# Patient Record
Sex: Male | Born: 2000 | Race: White | Hispanic: No | Marital: Single | Smoking: Never smoker
Health system: Southern US, Community
[De-identification: ages and names within clinical notes are randomized; demographics above are authoritative.]

## PROBLEM LIST (undated history)

## (undated) DIAGNOSIS — J45909 Unspecified asthma, uncomplicated: Secondary | ICD-10-CM

---
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
## 2020-04-22 ENCOUNTER — Ambulatory Visit: Payer: 59

## 2022-11-15 ENCOUNTER — Emergency Department (HOSPITAL_COMMUNITY): Payer: No Typology Code available for payment source

## 2022-11-15 ENCOUNTER — Emergency Department (HOSPITAL_COMMUNITY)
Admission: EM | Admit: 2022-11-15 | Discharge: 2022-11-15 | Disposition: A | Discharge status: Home / Self Care | Payer: No Typology Code available for payment source | Attending: Emergency Medicine | Admitting: Emergency Medicine

## 2022-11-15 ENCOUNTER — Other Ambulatory Visit: Payer: Self-pay

## 2022-11-15 ENCOUNTER — Encounter (HOSPITAL_COMMUNITY): Payer: Self-pay | Admitting: Emergency Medicine

## 2022-11-15 DIAGNOSIS — W228XXA Striking against or struck by other objects, initial encounter: Secondary | ICD-10-CM | POA: Insufficient documentation

## 2022-11-15 DIAGNOSIS — S90811A Abrasion, right foot, initial encounter: Secondary | ICD-10-CM | POA: Diagnosis not present

## 2022-11-15 DIAGNOSIS — S99921A Unspecified injury of right foot, initial encounter: Secondary | ICD-10-CM | POA: Diagnosis present

## 2022-11-15 DIAGNOSIS — S9031XA Contusion of right foot, initial encounter: Secondary | ICD-10-CM

## 2022-11-15 NOTE — ED Provider Notes (Signed)
[facility] EMERGENCY DEPARTMENT Provider Note   CSN: [id number] Arrival date & time: 11/15/22  2208     History  No chief complaint on file.   Jonathon Bradford is a 21 y.o. male.  Patient is a 21 year old male presenting with complaints of a right foot injury.  Patient tells me that his foot was rolled over by a pallet jack.  He describes swelling and pain to the dorsum of the right foot.  He is able to bear weight, however with a slight limp.  The history is provided by the patient.       Home Medications Prior to Admission medications   Medication Sig Start Date End Date Taking? Authorizing Provider  acetaminophen (TYLENOL) 500 MG tablet Take 1,500 mg by mouth daily as needed for moderate pain.     [provider]  HYDROcodone-acetaminophen (NORCO/VICODIN) 5-325 MG per tablet 1 at at bedtime, or every 4 hours if needed for severe pain. 11/11/14   Ivery Quale, PA-C  ibuprofen (ADVIL,MOTRIN) 800 MG tablet Take 1 tablet (800 mg total) by mouth 3 (three) times daily. 11/24/14   Vickki Hearing, MD  LORazepam (ATIVAN) 0.5 MG tablet Take 1 tablet (0.5 mg total) by mouth every 8 (eight) hours. Patient not taking: Reported on 11/11/2014 11/13/13   Donnetta Hutching, MD  naproxen sodium (ALEVE) 220 MG tablet Take 440 mg by mouth daily as needed (pain).     [provider]      Allergies    Patient has no known allergies.    Review of Systems   Review of Systems  All other systems reviewed and are negative.   Physical Exam Updated Vital Signs BP (!) 140/80 (BP Location: Right Arm)   Pulse 97   Temp 98.2 F (36.8 C) (Oral)   Resp 18   Ht 5\' 11"  (1.803 m)   Wt 59 kg   SpO2 100%   BMI 18.14 kg/m  Physical Exam Vitals and nursing note reviewed.  Constitutional:      General: He is not in acute distress.    Appearance: Normal appearance. He is not ill-appearing.  HENT:     Head: Normocephalic and atraumatic.  Pulmonary:     Effort: Pulmonary effort  is normal.  Musculoskeletal:     Comments: There are abrasions and swelling to the dorsum of the right foot.  There is no gross deformity.  Capillary refill is brisk to all toes and motor and sensation are intact throughout the entire foot.  Skin:    General: Skin is warm and dry.  Neurological:     Mental Status: He is alert.     ED Results / Procedures / Treatments   Labs (all labs ordered are listed, but only abnormal results are displayed) Labs Reviewed - No data to display  EKG None  Radiology DG Foot Complete Right  Result Date: 11/15/2022 CLINICAL DATA:  Pain and swelling. It was ran over by a 11/17/2022. EXAM: RIGHT FOOT COMPLETE - 3+ VIEW; RIGHT ANKLE - COMPLETE 3+ VIEW COMPARISON:  None Available. FINDINGS: Right ankle/foot: There is no evidence of fracture or dislocation. There is no evidence of arthropathy or other focal bone abnormality. Soft tissues swelling about anterior aspect of the ankle and dorsum of the foot. IMPRESSION: Soft tissue swelling about the anterior aspect of the ankle and dorsum of the foot. No fracture or dislocation. Electronically Signed   By: [employer] D.O.   On: 11/15/2022 22:47  DG Ankle Complete Right  Result Date: 11/15/2022 CLINICAL DATA:  Pain and swelling. It was ran over by a [employer]. EXAM: RIGHT FOOT COMPLETE - 3+ VIEW; RIGHT ANKLE - COMPLETE 3+ VIEW COMPARISON:  None Available. FINDINGS: Right ankle/foot: There is no evidence of fracture or dislocation. There is no evidence of arthropathy or other focal bone abnormality. Soft tissues swelling about anterior aspect of the ankle and dorsum of the foot. IMPRESSION: Soft tissue swelling about the anterior aspect of the ankle and dorsum of the foot. No fracture or dislocation. Electronically Signed   By: [employer] D.O.   On: 11/15/2022 22:47    Procedures Procedures    Medications Ordered in ED Medications - No data to display  ED Course/ Medical Decision Making/ A&P  X-rays  are negative for fracture.  Patient to be treated as a contusion with rest, ice, ibuprofen, and follow-up as needed.  Final Clinical Impression(s) / ED Diagnoses Final diagnoses:  None    Rx / DC Orders ED Discharge Orders     None         Veryl Speak, MD 11/15/22 2328

## 2022-11-15 NOTE — ED Triage Notes (Signed)
Pt states his right foot was hit with pallet jack. Pt is ambulatory.

## 2022-11-15 NOTE — Discharge Instructions (Addendum)
Rest.  Ice for 20 minutes every 2 hours while awake for the next 2 days.  Elevate your foot is much as possible.  Wear Ace bandage for comfort and support.  Take ibuprofen 600 mg every 6 hours as needed for pain.  Follow-up with primary doctor if symptoms or not improving in the next week.

## 2024-02-21 ENCOUNTER — Other Ambulatory Visit: Payer: Self-pay

## 2024-02-21 ENCOUNTER — Ambulatory Visit
Admission: RE | Admit: 2024-02-21 | Discharge: 2024-02-21 | Disposition: A | Discharge status: Home / Self Care | Payer: Self-pay | Source: Ambulatory Visit | Attending: Family Medicine | Admitting: Family Medicine

## 2024-02-21 VITALS — BP 140/89 | HR 100 | Temp 98.2°F | Resp 20

## 2024-02-21 DIAGNOSIS — J101 Influenza due to other identified influenza virus with other respiratory manifestations: Secondary | ICD-10-CM | POA: Diagnosis not present

## 2024-02-21 MED ORDER — PSEUDOEPH-BROMPHEN-DM 30-2-10 MG/5ML PO SYRP: 5.0000 mL | ORAL_SOLUTION | Freq: Four times a day (QID) | ORAL | 0 refills | Status: AC | PRN

## 2024-02-21 MED ORDER — OSELTAMIVIR PHOSPHATE 75 MG PO CAPS: 75.0000 mg | ORAL_CAPSULE | Freq: Two times a day (BID) | ORAL | 0 refills | Status: AC

## 2024-02-21 NOTE — ED Provider Notes (Signed)
 [facility]    CSN: [id number] Arrival date & time: 02/21/24  5784      History   Chief Complaint Chief Complaint  Patient presents with   Influenza    Took an at home Covid, Flu A & B test and Flu A tested positive - Entered by patient    HPI Jonathon Bradford is a 23 y.o. male.   Patient presenting today with 3-day history of bodyaches, cough, fever, headache, runny nose, fatigue.  Denies chest pain, shortness of breath, abdominal pain, diarrhea.  Home test positive for influenza A.  So far taking TheraFlu and Tylenol with mild temporary benefit.    Past Medical History:  Diagnosis Date   Asthma     There are no active problems to display for this patient.   History reviewed. No pertinent surgical history.     Home Medications    Prior to Admission medications   Medication Sig Start Date End Date Taking? Authorizing Provider  brompheniramine-pseudoephedrine-DM 30-2-10 MG/5ML syrup Take 5 mLs by mouth 4 (four) times daily as needed. 02/21/24  Yes Particia Nearing, PA-C  oseltamivir (TAMIFLU) 75 MG capsule Take 1 capsule (75 mg total) by mouth every 12 (twelve) hours. 02/21/24  Yes Particia Nearing, PA-C  acetaminophen (TYLENOL) 500 MG tablet Take 1,500 mg by mouth daily as needed for moderate pain.     [provider]  HYDROcodone-acetaminophen (NORCO/VICODIN) 5-325 MG per tablet 1 at at bedtime, or every 4 hours if needed for severe pain. 11/11/14   Ivery Quale, PA-C  ibuprofen (ADVIL,MOTRIN) 800 MG tablet Take 1 tablet (800 mg total) by mouth 3 (three) times daily. 11/24/14   Vickki Hearing, MD  LORazepam (ATIVAN) 0.5 MG tablet Take 1 tablet (0.5 mg total) by mouth every 8 (eight) hours. Patient not taking: Reported on 11/11/2014 11/13/13   Donnetta Hutching, MD  naproxen sodium (ALEVE) 220 MG tablet Take 440 mg by mouth daily as needed (pain).     [provider]    Family History History reviewed. No pertinent family  history.  Social History Social History   Tobacco Use   Smoking status: Never   Smokeless tobacco: Never  Substance Use Topics   Alcohol use: Yes    Comment: occ     Allergies   Patient has no known allergies.   Review of Systems Review of Systems PER HPI  Physical Exam Triage Vital Signs ED Triage Vitals  Encounter Vitals Group     BP 02/21/24 1011 (!) 140/89     Systolic BP Percentile --      Diastolic BP Percentile --      Pulse Rate 02/21/24 1011 100     Resp 02/21/24 1011 20     Temp 02/21/24 1011 98.2 F (36.8 C)     Temp Source 02/21/24 1011 Oral     SpO2 02/21/24 1011 97 %     Weight --      Height --      Head Circumference --      Peak Flow --      Pain Score 02/21/24 1012 5     Pain Loc --      Pain Education --      Exclude from Growth Chart --    No data found.  Updated Vital Signs BP (!) 140/89 (BP Location: Right Arm)   Pulse 100   Temp 98.2 F (36.8 C) (Oral)   Resp 20   SpO2 97%  Visual Acuity Right Eye Distance:   Left Eye Distance:   Bilateral Distance:    Right Eye Near:   Left Eye Near:    Bilateral Near:     Physical Exam Vitals and nursing note reviewed.  Constitutional:      Appearance: He is well-developed.  HENT:     Head: Atraumatic.     Right Ear: External ear normal.     Left Ear: External ear normal.     Nose: Rhinorrhea present.     Mouth/Throat:     Pharynx: Posterior oropharyngeal erythema present. No oropharyngeal exudate.  Eyes:     Conjunctiva/sclera: Conjunctivae normal.     Pupils: Pupils are equal, round, and reactive to light.  Cardiovascular:     Rate and Rhythm: Normal rate and regular rhythm.  Pulmonary:     Effort: Pulmonary effort is normal. No respiratory distress.     Breath sounds: No wheezing or rales.  Musculoskeletal:        General: Normal range of motion.     Cervical back: Normal range of motion and neck supple.  Lymphadenopathy:     Cervical: No cervical adenopathy.  Skin:     General: Skin is warm and dry.  Neurological:     Mental Status: He is alert and oriented to person, place, and time.  Psychiatric:        Behavior: Behavior normal.     UC Treatments / Results  Labs (all labs ordered are listed, but only abnormal results are displayed) Labs Reviewed - No data to display  EKG   Radiology No results found.  Procedures Procedures (including critical care time)  Medications Ordered in UC Medications - No data to display  Initial Impression / Assessment and Plan / UC Course  I have reviewed the triage vital signs and the nursing notes.  Pertinent labs & imaging results that were available during my care of the patient were reviewed by me and considered in my medical decision making (see chart for details).     Home test positive for influenza A.  Treat with Tamiflu, Bromfed syrup, supportive over-the-counter medications and home care.  Work note given.  Final Clinical Impressions(s) / UC Diagnoses   Final diagnoses:  Influenza A   Discharge Instructions   None    ED Prescriptions     Medication Sig Dispense Auth. Provider   oseltamivir (TAMIFLU) 75 MG capsule Take 1 capsule (75 mg total) by mouth every 12 (twelve) hours. 10 capsule Particia Nearing, [place]   brompheniramine-pseudoephedrine-DM 30-2-10 MG/5ML syrup Take 5 mLs by mouth 4 (four) times daily as needed. 120 mL Particia Nearing, [place]      PDMP not reviewed this encounter.   Particia Nearing, [place] 02/21/24 1045

## 2024-02-21 NOTE — ED Triage Notes (Signed)
 Pt reports generalized body aches, runny nose, headache, intermittent cough and fever since Tuesday. Tested positive for flu A last night.
